# Patient Record
Sex: Male | Born: 1970
Health system: Southern US, Community
[De-identification: ages and names within clinical notes are randomized; demographics above are authoritative.]

## PROBLEM LIST (undated history)

## (undated) ENCOUNTER — Emergency Department (HOSPITAL_COMMUNITY): Payer: Self-pay | Source: Home / Self Care

---

## 1999-07-29 ENCOUNTER — Emergency Department (HOSPITAL_COMMUNITY): Admission: EM | Admit: 1999-07-29 | Discharge: 1999-07-29 | Payer: Self-pay | Admitting: Emergency Medicine

## 1999-08-03 ENCOUNTER — Emergency Department (HOSPITAL_COMMUNITY): Admission: EM | Admit: 1999-08-03 | Discharge: 1999-08-03 | Payer: Self-pay | Admitting: Emergency Medicine

## 2002-11-29 ENCOUNTER — Encounter: Payer: Self-pay | Admitting: Emergency Medicine

## 2002-11-29 ENCOUNTER — Emergency Department (HOSPITAL_COMMUNITY): Admission: EM | Admit: 2002-11-29 | Discharge: 2002-11-29 | Payer: Self-pay | Admitting: Emergency Medicine

## 2003-04-30 ENCOUNTER — Emergency Department (HOSPITAL_COMMUNITY): Admission: EM | Admit: 2003-04-30 | Discharge: 2003-04-30 | Payer: Self-pay | Admitting: Emergency Medicine

## 2004-02-07 ENCOUNTER — Emergency Department (HOSPITAL_COMMUNITY): Admission: EM | Admit: 2004-02-07 | Discharge: 2004-02-07 | Payer: Self-pay | Admitting: Emergency Medicine

## 2004-03-13 ENCOUNTER — Emergency Department (HOSPITAL_COMMUNITY): Admission: EM | Admit: 2004-03-13 | Discharge: 2004-03-13 | Payer: Self-pay | Admitting: Emergency Medicine

## 2010-01-28 ENCOUNTER — Emergency Department (HOSPITAL_COMMUNITY): Admission: EM | Admit: 2010-01-28 | Discharge: 2010-01-28 | Payer: Self-pay | Admitting: Emergency Medicine

## 2010-05-23 LAB — DIFFERENTIAL
Basophils Absolute: 0 10*3/uL (ref 0.0–0.1)
Basophils Relative: 1 % (ref 0–1)
Eosinophils Absolute: 0.1 10*3/uL (ref 0.0–0.7)
Eosinophils Relative: 1 % (ref 0–5)
Lymphocytes Relative: 32 % (ref 12–46)
Lymphs Abs: 1.5 10*3/uL (ref 0.7–4.0)
Monocytes Absolute: 0.3 10*3/uL (ref 0.1–1.0)
Monocytes Relative: 7 % (ref 3–12)
Neutro Abs: 2.7 10*3/uL (ref 1.7–7.7)
Neutrophils Relative %: 59 % (ref 43–77)

## 2010-05-23 LAB — POCT CARDIAC MARKERS
CKMB, poc: 2 ng/mL (ref 1.0–8.0)
Myoglobin, poc: 64.1 ng/mL (ref 12–200)
Troponin i, poc: <0.05 ng/mL (ref 0.00–0.09)

## 2010-05-23 LAB — CBC
HCT: 40.6 % (ref 39.0–52.0)
Hemoglobin: 14.4 g/dL (ref 13.0–17.0)
MCH: 32.9 pg (ref 26.0–34.0)
MCHC: 35.6 g/dL (ref 30.0–36.0)
MCV: 92.4 fL (ref 78.0–100.0)
Platelets: 194 10*3/uL (ref 150–400)
RBC: 4.39 MIL/uL (ref 4.22–5.81)
RDW: 11.9 % (ref 11.5–15.5)
WBC: 4.6 10*3/uL (ref 4.0–10.5)

## 2010-05-23 LAB — POCT I-STAT, CHEM 8
BUN: 19 mg/dL (ref 6–23)
Calcium, Ion: 1.19 mmol/L (ref 1.12–1.32)
Chloride: 105 mEq/L (ref 96–112)
Creatinine, Ser: 0.8 mg/dL (ref 0.4–1.5)
Glucose, Bld: 189 mg/dL — ABNORMAL HIGH (ref 70–99)
HCT: 43 % (ref 39.0–52.0)
Hemoglobin: 14.6 g/dL (ref 13.0–17.0)
Potassium: 3.6 mEq/L (ref 3.5–5.1)
Sodium: 139 mEq/L (ref 135–145)
TCO2: 27 mmol/L (ref 0–100)

## 2010-06-24 ENCOUNTER — Emergency Department (HOSPITAL_COMMUNITY)
Admission: EM | Admit: 2010-06-24 | Discharge: 2010-06-24 | Disposition: A | Discharge status: Home / Self Care | Payer: Self-pay | Attending: Emergency Medicine | Admitting: Emergency Medicine

## 2010-06-24 DIAGNOSIS — R300 Dysuria: Secondary | ICD-10-CM | POA: Insufficient documentation

## 2010-06-24 LAB — URINALYSIS, ROUTINE W REFLEX MICROSCOPIC
Bilirubin Urine: NEGATIVE
Glucose, UA: NEGATIVE mg/dL
Hgb urine dipstick: NEGATIVE
Ketones, ur: NEGATIVE mg/dL
Nitrite: NEGATIVE
Protein, ur: NEGATIVE mg/dL
Specific Gravity, Urine: 1.016 (ref 1.005–1.030)
Urobilinogen, UA: 0.2 mg/dL (ref 0.0–1.0)
pH: 6 (ref 5.0–8.0)

## 2010-06-26 LAB — GC/CHLAMYDIA PROBE AMP, GENITAL
Chlamydia, DNA Probe: NEGATIVE
GC Probe Amp, Genital: NEGATIVE

## 2012-05-21 ENCOUNTER — Emergency Department (HOSPITAL_COMMUNITY)
Admission: EM | Admit: 2012-05-21 | Discharge: 2012-05-22 | Disposition: A | Discharge status: Home / Self Care | Payer: Worker's Compensation | Attending: Emergency Medicine | Admitting: Emergency Medicine

## 2012-05-21 ENCOUNTER — Encounter (HOSPITAL_COMMUNITY): Payer: Self-pay | Admitting: *Deleted

## 2012-05-21 ENCOUNTER — Emergency Department (HOSPITAL_COMMUNITY): Payer: Worker's Compensation

## 2012-05-21 DIAGNOSIS — Y939 Activity, unspecified: Secondary | ICD-10-CM | POA: Insufficient documentation

## 2012-05-21 DIAGNOSIS — S8990XA Unspecified injury of unspecified lower leg, initial encounter: Secondary | ICD-10-CM | POA: Insufficient documentation

## 2012-05-21 DIAGNOSIS — M79672 Pain in left foot: Secondary | ICD-10-CM

## 2012-05-21 DIAGNOSIS — Z79899 Other long term (current) drug therapy: Secondary | ICD-10-CM | POA: Insufficient documentation

## 2012-05-21 DIAGNOSIS — Y9289 Other specified places as the place of occurrence of the external cause: Secondary | ICD-10-CM | POA: Insufficient documentation

## 2012-05-21 DIAGNOSIS — W230XXA Caught, crushed, jammed, or pinched between moving objects, initial encounter: Secondary | ICD-10-CM | POA: Insufficient documentation

## 2012-05-21 DIAGNOSIS — Y990 Civilian activity done for income or pay: Secondary | ICD-10-CM | POA: Insufficient documentation

## 2012-05-21 DIAGNOSIS — R269 Unspecified abnormalities of gait and mobility: Secondary | ICD-10-CM | POA: Insufficient documentation

## 2012-05-21 NOTE — ED Notes (Signed)
Pt ran over left foot with large box of food; states approx 400 lb weight over foot; bruising and swelling per patient

## 2012-05-23 NOTE — ED Provider Notes (Signed)
Medical screening examination/treatment/procedure(s) were performed by non-physician practitioner and as supervising physician I was immediately available for consultation/collaboration.  Flint Melter, MD 05/23/12 2100

## 2012-05-23 NOTE — ED Provider Notes (Signed)
History     CSN: [id number]  Arrival date & time 05/21/12  2138   First MD Initiated Contact with Patient 05/21/12 2348      Chief Complaint  Patient presents with  . Foot Injury    (Consider location/radiation/quality/duration/timing/severity/associated sxs/prior treatment) HPI Comments: 42 y.o. Male presents complaining of left foot pain after trapping it between a heavy box and the wall earlier today at work. Pt states he drove himself here and walked into the ED on his own. Pt states pain is 4/10, localized, and worse with weight bearing. Pt took no interventions.     Patient is a 42 y.o. male presenting with foot injury.  Foot Injury Associated symptoms: no fever and no neck pain     History reviewed. No pertinent past medical history.  History reviewed. No pertinent past surgical history.  No family history on file.  History  Substance Use Topics  . Smoking status: Never Smoker   . Smokeless tobacco: Not on file  . Alcohol Use: No      Review of Systems  Constitutional: Negative for fever and diaphoresis.  HENT: Negative for neck pain and neck stiffness.   Eyes: Negative for visual disturbance.  Respiratory: Negative for shortness of breath.   Cardiovascular: Negative for chest pain.  Gastrointestinal: Negative for nausea and vomiting.  Genitourinary: Negative for dysuria.  Musculoskeletal: Positive for gait problem. Negative for joint swelling.       Difficult to bear weight  Skin:       Left foot bruised  Neurological: Negative for dizziness, weakness, light-headedness, numbness and headaches.    Allergies  Penicillins  Home Medications   Current Outpatient Rx  Name  Route  Sig  Dispense  Refill  . acetaminophen (TYLENOL) 500 MG tablet   Oral   Take 500 mg by mouth every 6 (six) hours as needed for pain.         Marland Kitchen ibuprofen (ADVIL,MOTRIN) 200 MG tablet   Oral   Take 200 mg by mouth every 6 (six) hours as needed for pain.         Marland Kitchen  lisinopril (PRINIVIL,ZESTRIL) 20 MG tablet   Oral   Take 20 mg by mouth every morning.           BP 109/72  Pulse 91  Temp(Src) 98 F (36.7 C) (Oral)  Resp 20  Ht 5\' 10"  (1.778 m)  Wt 180 lb (81.647 kg)  BMI 25.83 kg/m2  SpO2 96%  Physical Exam  Nursing note and vitals reviewed. Constitutional: He is oriented to person, place, and time. He appears well-developed and well-nourished. No distress.  HENT:  Head: Normocephalic and atraumatic.  Eyes: Conjunctivae and EOM are normal.  Neck: Normal range of motion. Neck supple.  No meningeal signs  Cardiovascular: Normal rate, regular rhythm and normal heart sounds.  Exam reveals no gallop and no friction rub.   No murmur heard. Pulmonary/Chest: Effort normal and breath sounds normal. No respiratory distress. He has no wheezes. He has no rales. He exhibits no tenderness.  Abdominal: Soft. Bowel sounds are normal. He exhibits no distension. There is no tenderness. There is no rebound and no guarding.  Musculoskeletal: Normal range of motion. He exhibits no edema and no tenderness.  FROM of injured extremity. 5/5 strength. No bony tenderness. No deformity. Ambulates well. No limp.   Neurological: He is alert and oriented to person, place, and time. No cranial nerve deficit.  No focal deficits. Sensation to light touch intact.  Skin: Skin is warm and dry. He is not diaphoretic. No erythema.  Minimal swelling, small bruise on left foot  Psychiatric: He has a normal mood and affect.    ED Course  Procedures (including critical care time)  Labs Reviewed - No data to display Dg Foot Complete Left  05/21/2012  *RADIOLOGY REPORT*  Clinical Data: Foot bruising and swelling, recent trauma.  LEFT FOOT - COMPLETE 3+ VIEW  Comparison: None.  Findings: No displaced acute fracture.  No dislocation. Contour irregularity along the anterior process of the calcaneus is favored to be a congenital variant or sequelae of prior trauma.  No aggressive  osseous lesion.  IMPRESSION: Contour irregularity along the anterior process of the calcaneus is nonspecific, however, favored to be a congenital variant or sequelae of prior trauma. Correlate for point tenderness.  Otherweise, no acute osseous finding of the left foot.   Original Report Authenticated By: Jearld Lesch, M.D.      1. Foot pain, left       MDM  Pt ambulates well with no sign of limp. Swelling is minimal. Imaging shows no fracture. Directed pt to ice injury, take acetaminophen or ibuprofen for pain, and to elevate and rest the injury when possible.  Pt stated that he really just needed to be cleared to return to work. Provided back to work note.   Glade Nurse, PA-C 05/23/12 2052

## 2016-01-31 ENCOUNTER — Ambulatory Visit (INDEPENDENT_AMBULATORY_CARE_PROVIDER_SITE_OTHER): Payer: BLUE CROSS/BLUE SHIELD | Admitting: Sports Medicine

## 2016-01-31 ENCOUNTER — Encounter: Payer: Self-pay | Admitting: Sports Medicine

## 2016-01-31 ENCOUNTER — Ambulatory Visit (INDEPENDENT_AMBULATORY_CARE_PROVIDER_SITE_OTHER): Payer: BLUE CROSS/BLUE SHIELD

## 2016-01-31 DIAGNOSIS — M722 Plantar fascial fibromatosis: Secondary | ICD-10-CM | POA: Diagnosis not present

## 2016-01-31 DIAGNOSIS — M79671 Pain in right foot: Secondary | ICD-10-CM

## 2016-01-31 MED ORDER — METHYLPREDNISOLONE 4 MG PO TBPK: ORAL_TABLET | ORAL | 0 refills | Status: AC

## 2016-01-31 MED ORDER — MELOXICAM 15 MG PO TABS: 15.0000 mg | ORAL_TABLET | Freq: Every day | ORAL | 0 refills | Status: DC

## 2016-01-31 NOTE — Progress Notes (Signed)
  Subjective: Delphina CahillCharles Hild is a 45 y.o. male patient presents to office with complaint of heel pain on the right. Patient admits to post static dyskinesia for 2 months in duration. Patient has treated this problem with heat and change in shoes with no relief. Denies any other pedal complaints.   There are no active problems to display for this patient.   Current Outpatient Prescriptions on File Prior to Visit  Medication Sig Dispense Refill  . acetaminophen (TYLENOL) 500 MG tablet Take 500 mg by mouth every 6 (six) hours as needed for pain.    Marland Kitchen. ibuprofen (ADVIL,MOTRIN) 200 MG tablet Take 200 mg by mouth every 6 (six) hours as needed for pain.    Marland Kitchen. lisinopril (PRINIVIL,ZESTRIL) 20 MG tablet Take 20 mg by mouth every morning.     No current facility-administered medications on file prior to visit.     Allergies  Allergen Reactions  . Penicillins Other (See Comments)    Childhood allergy; reaction unknown    Objective: Physical Exam General: The patient is alert and oriented x3 in no acute distress.  Dermatology: Skin is warm, dry and supple bilateral lower extremities. Nails 1-10 are normal. There is no erythema, edema, no eccymosis, no open lesions present. Integument is otherwise unremarkable.  Vascular: Dorsalis Pedis pulse and Posterior Tibial pulse are 2/4 bilateral. Capillary fill time is immediate to all digits.  Neurological: Grossly intact to light touch with an achilles reflex of +2/5 and a negative Tinel's sign bilateral.  Musculoskeletal: Minimal Tenderness to palpation at the medial calcaneal tubercale and through the insertion of the plantar fascia on the right foot. No pain with compression of calcaneus bilateral. No pain with tuning fork to calcaneus bilateral. No pain with calf compression bilateral. There is decreased Ankle joint range of motion bilateral. All other joints range of motion within normal limits bilateral. Strength 5/5 in all groups bilateral.    Xray, Right foot:  Normal osseous mineralization. Joint spaces preserved. No dislocation/boney destruction. Avulsion fleck posterior calcaneus. Calcaneal spur present with mild thickening of plantar fascia. No other soft tissue abnormalities or radiopaque foreign bodies.   Assessment and Plan: Problem List Items Addressed This Visit    None    Visit Diagnoses    Right foot pain    -  Primary   Relevant Orders   DG Foot 2 Views Right   Plantar fasciitis          -Complete examination performed.  -Xrays reviewed -Discussed with patient in detail the condition of plantar fasciitis, how this occurs and general treatment options. Explained both conservative and surgical treatments.  -No injection due to minimal symptoms on right.  -Rx Meloxicam to start after Medrol dose pack is completed -Recommended good supportive shoes and advised use of OTC insert. Explained to patient that if these orthoses work well, we will continue with these. If these do not improve his condition and  pain, we will consider custom molded orthoses. - Explained in detail the use of the fascial brace which was dispensed at today's visit. -Explained and dispensed to patient daily stretching exercises. -Recommend patient to ice affected area 1-2x daily. -Patient to return to office in 4 weeks for follow up or sooner if problems or questions arise.  Asencion Islamitorya Romanita Fager, DPM

## 2016-01-31 NOTE — Patient Instructions (Signed)

## 2016-02-28 ENCOUNTER — Ambulatory Visit (INDEPENDENT_AMBULATORY_CARE_PROVIDER_SITE_OTHER): Payer: BLUE CROSS/BLUE SHIELD | Admitting: Sports Medicine

## 2016-02-28 ENCOUNTER — Encounter: Payer: Self-pay | Admitting: Sports Medicine

## 2016-02-28 ENCOUNTER — Ambulatory Visit: Payer: BLUE CROSS/BLUE SHIELD | Admitting: Sports Medicine

## 2016-02-28 DIAGNOSIS — M79671 Pain in right foot: Secondary | ICD-10-CM

## 2016-02-28 DIAGNOSIS — M722 Plantar fascial fibromatosis: Secondary | ICD-10-CM | POA: Diagnosis not present

## 2016-02-28 DIAGNOSIS — M79672 Pain in left foot: Secondary | ICD-10-CM

## 2016-02-28 NOTE — Progress Notes (Signed)
Subjective: Jeremy May is a 45 y.o. male patient returns for follow-up exam of heel pain on the right. Patient admits to post static dyskinesia now on left foot. States that he was doing well until he completed his dose pack; has not been stretching or icing or wearing his brace. Denies any other pedal complaints.   There are no active problems to display for this patient.   Current Outpatient Prescriptions on File Prior to Visit  Medication Sig Dispense Refill  . acetaminophen (TYLENOL) 500 MG tablet Take 500 mg by mouth every 6 (six) hours as needed for pain.    Marland Kitchen. Clocortolone Pivalate (CLODERM) 0.1 % cream apply to affected area three times a day  0  . FLUARIX QUADRIVALENT 0.5 ML injection inject 0.5 milliliter intramuscularly  0  . ibuprofen (ADVIL,MOTRIN) 200 MG tablet Take 200 mg by mouth every 6 (six) hours as needed for pain.    Marland Kitchen. ketoconazole (NIZORAL) 2 % cream   0  . lisinopril (PRINIVIL,ZESTRIL) 20 MG tablet Take 20 mg by mouth every morning.    . meloxicam (MOBIC) 15 MG tablet Take 1 tablet (15 mg total) by mouth daily. 30 tablet 0  . methylPREDNISolone (MEDROL DOSEPAK) 4 MG TBPK tablet Take 1st as instructed 21 tablet 0  . tamsulosin (FLOMAX) 0.4 MG CAPS capsule   0   No current facility-administered medications on file prior to visit.     Allergies  Allergen Reactions  . Penicillins Other (See Comments)    Childhood allergy; reaction unknown    Objective: Physical Exam General: The patient is alert and oriented x3 in no acute distress.  Dermatology: Skin is warm, dry and supple bilateral lower extremities. Nails 1-10 are normal. There is no erythema, edema, no eccymosis, no open lesions present. Integument is otherwise unremarkable.  Vascular: Dorsalis Pedis pulse and Posterior Tibial pulse are 2/4 bilateral. Capillary fill time is immediate to all digits.  Neurological: Grossly intact to light touch with an achilles reflex of +2/5 and a negative Tinel's sign  bilateral.  Musculoskeletal: Minimal Tenderness to palpation at the medial calcaneal tubercale and through the insertion of the plantar fascia on the right foot extending to arch and first metatarsophalangeal joint. Subjective tenderness on the left. No pain with compression of calcaneus bilateral. No pain with tuning fork to calcaneus bilateral. No pain with calf compression bilateral. There is decreased Ankle joint range of motion bilateral. All other joints range of motion within normal limits bilateral. Strength 5/5 in all groups bilateral.   Assessment and Plan: Problem List Items Addressed This Visit    None    Visit Diagnoses    Plantar fasciitis    -  Primary   Right foot pain       Left foot pain          -Complete examination performed.  -Previous Xrays reviewed -Discussed with patient in detail the condition of plantar fasciitis, how this occurs and general treatment options. Explained both conservative and surgical treatments.  -No injection due to minimal symptoms bilateral  -Continue with meloxicam until completed -As previous Recommended good supportive shoes and advised use of OTC insert. Explained to patient that if these orthoses work well, we will continue with these. If these do not improve his condition and  pain, we will consider custom molded orthoses. - Explained in detail the use of the fascial brace which was dispensed at today's visit for the left and advised patient to return to using brace for right  as instructed. -Re-Explained and dispensed to patient daily stretching exercises and advised patient to be compliant with stretching to help his condition gets better -Recommend patient to ice affected area 1-2x daily as previously instructed. -Patient to return to office in 6 weeks for follow up or sooner if problems or questions arise. If no improvement and if pain is localized to a trigger point area, We'll consider injection.  Asencion Islamitorya Marykathleen Russi, DPM

## 2016-02-28 NOTE — Patient Instructions (Signed)

## 2016-04-18 ENCOUNTER — Telehealth: Payer: Self-pay | Admitting: *Deleted

## 2016-04-18 DIAGNOSIS — M79671 Pain in right foot: Secondary | ICD-10-CM

## 2016-04-18 DIAGNOSIS — M722 Plantar fascial fibromatosis: Secondary | ICD-10-CM

## 2016-04-18 MED ORDER — MELOXICAM 15 MG PO TABS
15.0000 mg | ORAL_TABLET | Freq: Every day | ORAL | 0 refills | Status: DC
Start: 2016-04-18 — End: 2022-10-01

## 2016-04-18 NOTE — Telephone Encounter (Signed)
Pt states Dr. Marylene LandStover said if he was still having pain after 6 months she would call in a oral medication. I spoke with pt, he states he began to see improvement after the steroid pack and the next day he began the Meloxicam. I told pt I would be able to refill the meloxicam, but he would need to make an appt prior to future refills. Pt states he is having to make otheer doctors appts and will call back.

## 2017-07-11 ENCOUNTER — Other Ambulatory Visit: Payer: Self-pay | Admitting: Gastroenterology

## 2017-07-11 DIAGNOSIS — R1032 Left lower quadrant pain: Secondary | ICD-10-CM

## 2017-07-22 ENCOUNTER — Other Ambulatory Visit: Payer: Self-pay | Admitting: Otolaryngology

## 2017-07-22 ENCOUNTER — Ambulatory Visit
Admission: RE | Admit: 2017-07-22 | Discharge: 2017-07-22 | Disposition: A | Discharge status: Home / Self Care | Payer: BLUE CROSS/BLUE SHIELD | Source: Ambulatory Visit | Attending: Otolaryngology | Admitting: Otolaryngology

## 2017-07-22 DIAGNOSIS — J320 Chronic maxillary sinusitis: Secondary | ICD-10-CM

## 2017-11-14 ENCOUNTER — Ambulatory Visit
Admission: RE | Admit: 2017-11-14 | Discharge: 2017-11-14 | Disposition: A | Discharge status: Home / Self Care | Payer: BLUE CROSS/BLUE SHIELD | Source: Ambulatory Visit | Attending: Family Medicine | Admitting: Family Medicine

## 2017-11-14 ENCOUNTER — Other Ambulatory Visit: Payer: Self-pay | Admitting: Family Medicine

## 2017-11-14 DIAGNOSIS — R0602 Shortness of breath: Secondary | ICD-10-CM

## 2018-03-20 ENCOUNTER — Other Ambulatory Visit: Payer: Self-pay | Admitting: Otolaryngology

## 2018-03-20 DIAGNOSIS — K219 Gastro-esophageal reflux disease without esophagitis: Secondary | ICD-10-CM

## 2018-03-24 ENCOUNTER — Ambulatory Visit
Admission: RE | Admit: 2018-03-24 | Discharge: 2018-03-24 | Disposition: A | Discharge status: Home / Self Care | Payer: BLUE CROSS/BLUE SHIELD | Source: Ambulatory Visit | Attending: Otolaryngology | Admitting: Otolaryngology

## 2018-03-24 DIAGNOSIS — K219 Gastro-esophageal reflux disease without esophagitis: Secondary | ICD-10-CM

## 2018-05-23 ENCOUNTER — Encounter: Payer: Self-pay | Admitting: Gastroenterology

## 2019-05-21 ENCOUNTER — Ambulatory Visit: Payer: Self-pay | Attending: Internal Medicine

## 2019-05-21 DIAGNOSIS — Z23 Encounter for immunization: Secondary | ICD-10-CM

## 2019-05-21 NOTE — Progress Notes (Signed)
   Covid-19 Vaccination Clinic  Name:  Jeremy May    MRN: [id number] DOB: [birth year]  05/21/2019  [name] was observed post Covid-19 immunization for 15 minutes without incident. He was provided with Vaccine Information Sheet and instruction to access the V-Safe system.   [name] was instructed to call 911 with any severe reactions post vaccine: Marland Kitchen Difficulty breathing  . Swelling of face and throat  . A fast heartbeat  . A bad rash all over body  . Dizziness and weakness   Immunizations Administered    Name Date Dose VIS Date Route   Pfizer COVID-19 Vaccine 05/21/2019  8:25 AM 0.3 mL 02/20/2019 Intramuscular   Manufacturer: [employer], [employer]   Lot: [id number]   NDC: [address]-4

## 2019-06-15 ENCOUNTER — Ambulatory Visit: Payer: Self-pay | Attending: Internal Medicine

## 2019-06-15 DIAGNOSIS — Z23 Encounter for immunization: Secondary | ICD-10-CM

## 2019-06-15 NOTE — Progress Notes (Signed)
   Covid-19 Vaccination Clinic  Name:  Jeremy May    MRN: [id number] DOB: 10-16-1970  06/15/2019  [name] was observed post Covid-19 immunization for 15 minutes without incident. He was provided with Vaccine Information Sheet and instruction to access the V-Safe system.   [name] was instructed to call 911 with any severe reactions post vaccine: Marland Kitchen Difficulty breathing  . Swelling of face and throat  . A fast heartbeat  . A bad rash all over body  . Dizziness and weakness   Immunizations Administered    Name Date Dose VIS Date Route   Pfizer COVID-19 Vaccine 06/15/2019  8:27 AM 0.3 mL 02/20/2019 Intramuscular   Manufacturer: [employer], [employer]   Lot: [id number]   NDC: [address]-8

## 2020-04-17 IMAGING — DX DG CHEST 2V
2 series · 2 of 2 positions shown · non-contrast
Comparison: None.

CLINICAL DATA: Shortness of breath with neck tightness since November 05, 2017.

EXAM:
CHEST - 2 VIEW

[dg chest 2 view (1 of 2)]
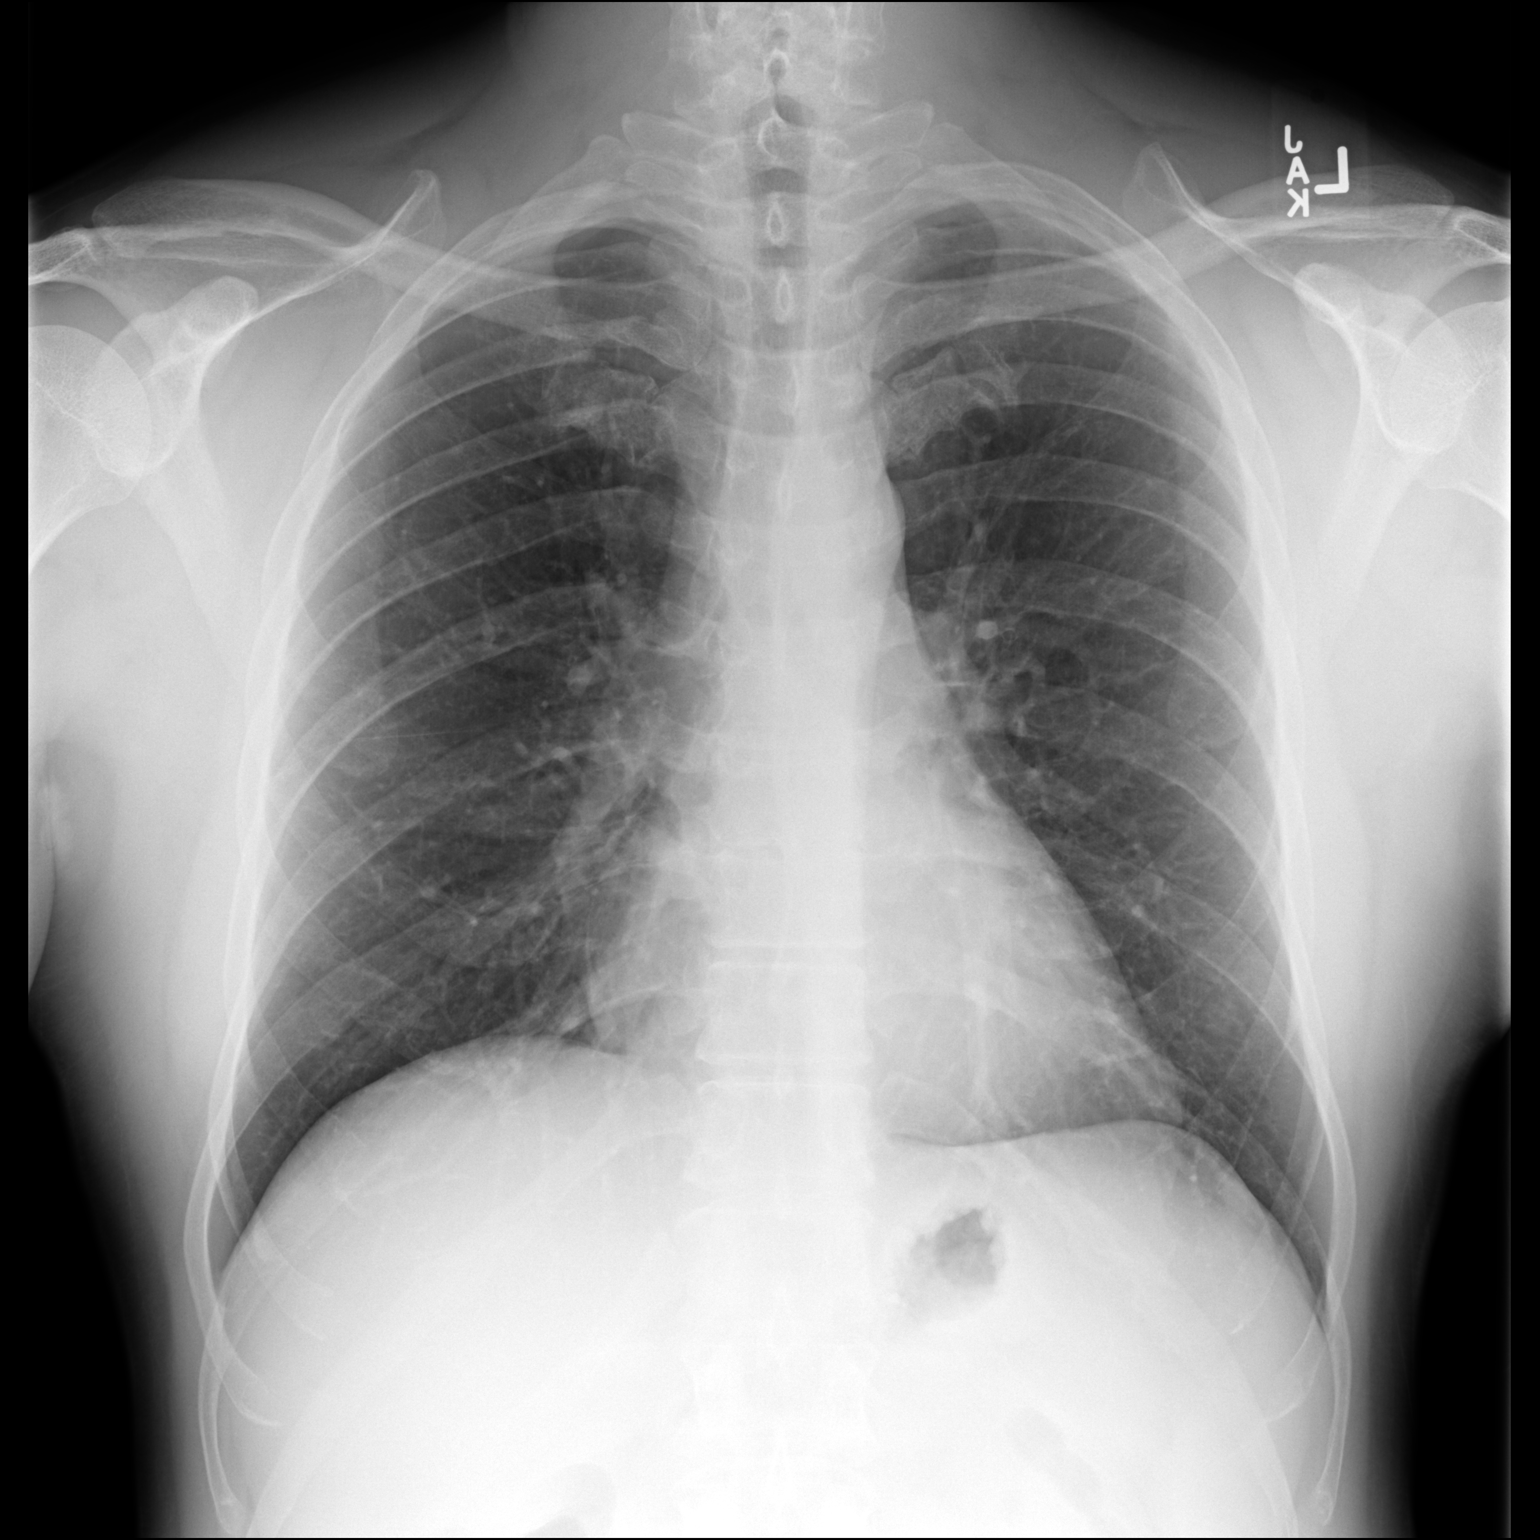

[dg chest 2 view (2 of 2)]
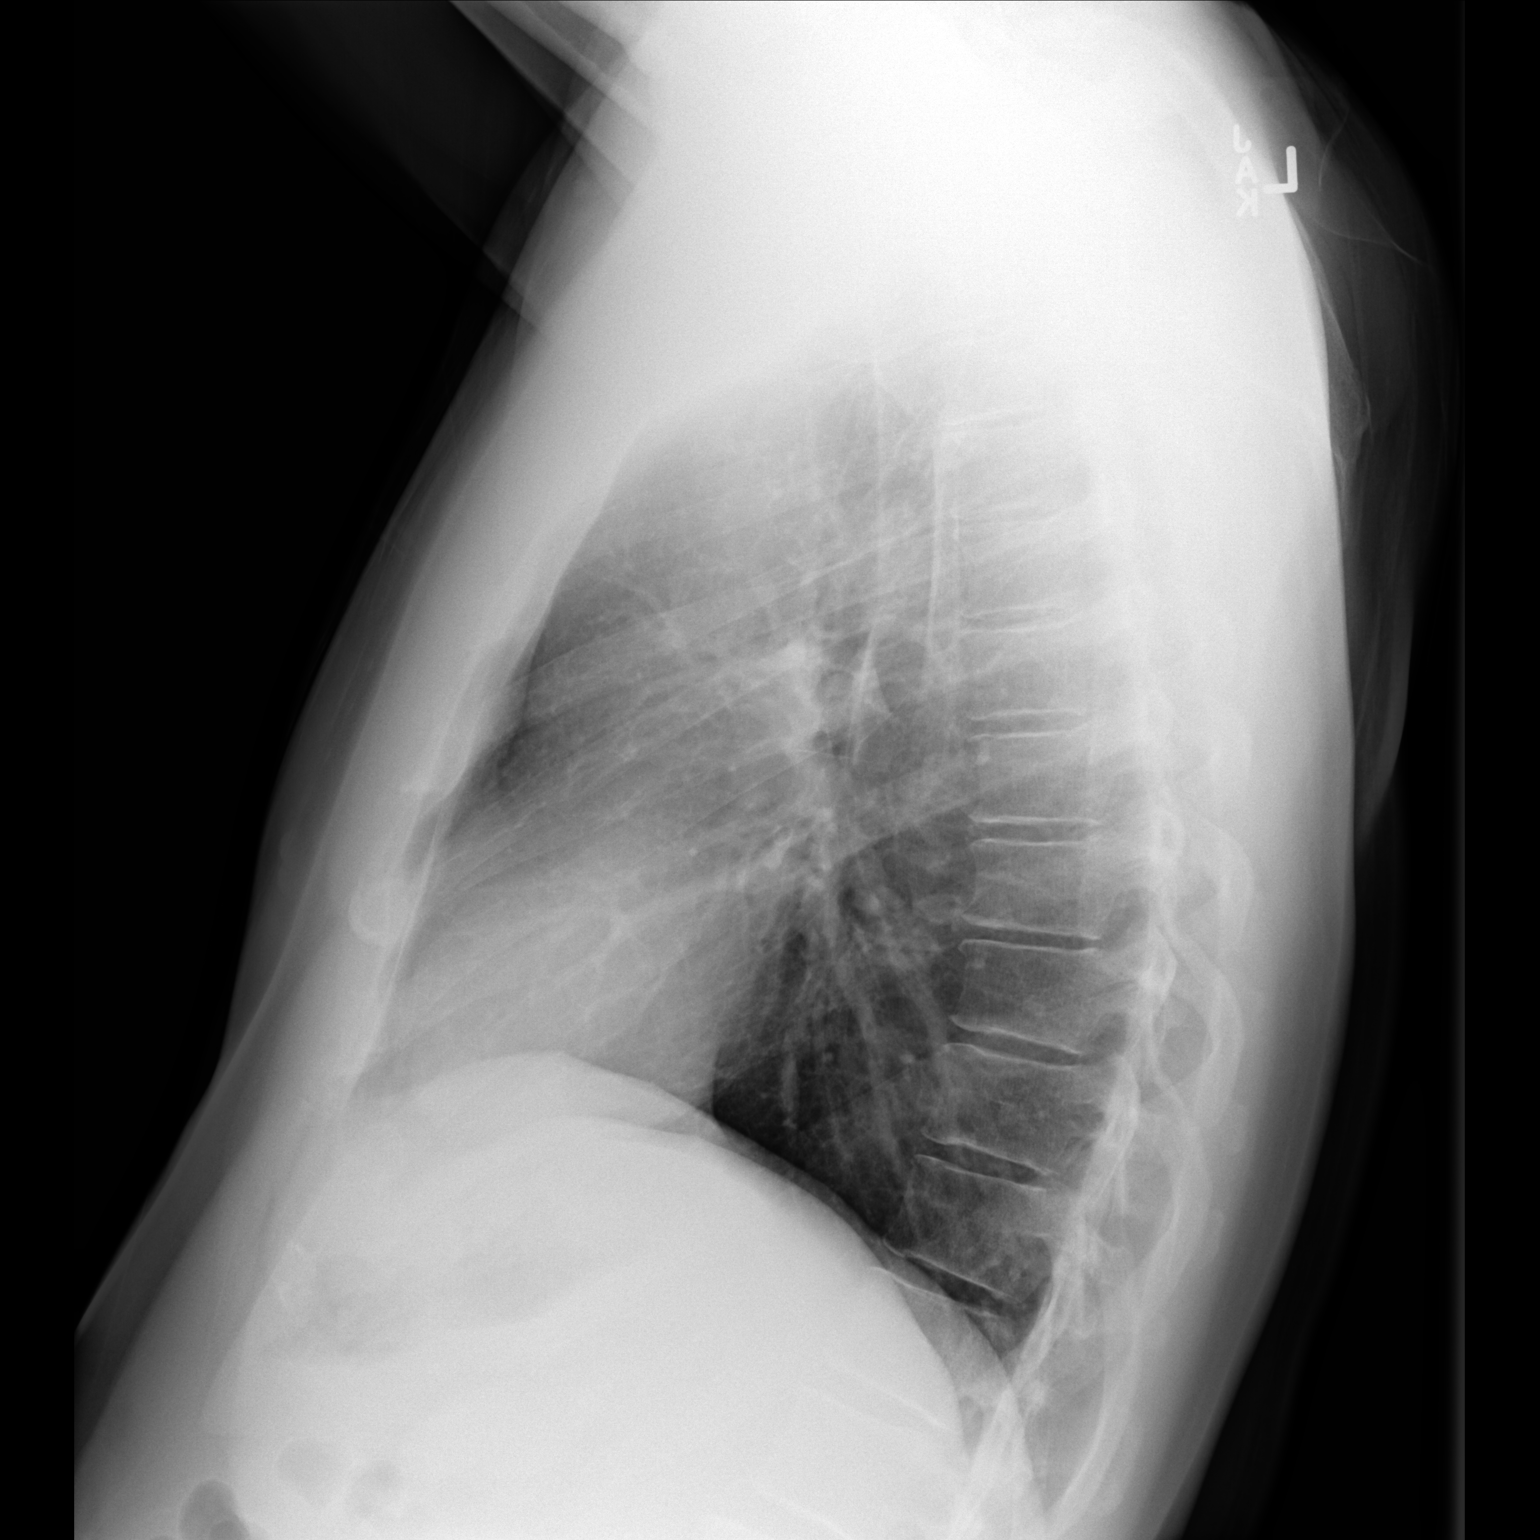

[2 of 2 positions shown; findings below may reference images not displayed]

FINDINGS: The heart size and mediastinal contours are within normal limits.
Both lungs are clear. The visualized skeletal structures are
unremarkable.
IMPRESSION: No active cardiopulmonary disease.

## 2022-10-01 ENCOUNTER — Ambulatory Visit (INDEPENDENT_AMBULATORY_CARE_PROVIDER_SITE_OTHER): Payer: No Typology Code available for payment source | Admitting: Podiatry

## 2022-10-01 ENCOUNTER — Encounter: Payer: Self-pay | Admitting: Podiatry

## 2022-10-01 ENCOUNTER — Ambulatory Visit (INDEPENDENT_AMBULATORY_CARE_PROVIDER_SITE_OTHER): Payer: No Typology Code available for payment source

## 2022-10-01 VITALS — BP 136/84 | HR 78

## 2022-10-01 DIAGNOSIS — M779 Enthesopathy, unspecified: Secondary | ICD-10-CM | POA: Diagnosis not present

## 2022-10-01 DIAGNOSIS — M7752 Other enthesopathy of left foot: Secondary | ICD-10-CM | POA: Diagnosis not present

## 2022-10-01 MED ORDER — MELOXICAM 15 MG PO TABS: 15.0000 mg | ORAL_TABLET | Freq: Every day | ORAL | 0 refills | Status: AC | PRN

## 2022-10-01 NOTE — Progress Notes (Signed)
Subjective:   Patient ID: Jeremy May, male   DOB: 52 y.o.   MRN: [id number]   HPI Chief Complaint  Patient presents with   Foot Pain    "I'm having problems with the left ankle." N - ankle pain L - lateral ankle left D- couple mos O - suddenly C - ache, sharp shooting pain every now and then A - walking, standing T - Aleve, Tylenol    Sometimes getting sharp shooting pain Lateral ankle going up Sometimes have to sit down No injuries he remembers- may have bumped it- maybe a remote hisotory but not since this tarted.   ROS  No past medical history on file.  No past surgical history on file.   Current Outpatient Medications:    acetaminophen (TYLENOL) 500 MG tablet, Take 500 mg by mouth every 6 (six) hours as needed for pain., Disp: , Rfl:    ibuprofen (ADVIL,MOTRIN) 200 MG tablet, Take 200 mg by mouth every 6 (six) hours as needed for pain., Disp: , Rfl:    Clocortolone Pivalate (CLODERM) 0.1 % cream, apply to affected area three times a day (Patient not taking: Reported on 10/01/2022), Disp: , Rfl: 0   FLUARIX QUADRIVALENT 0.5 ML injection, inject 0.5 milliliter intramuscularly (Patient not taking: Reported on 10/01/2022), Disp: , Rfl: 0   ketoconazole (NIZORAL) 2 % cream, , Disp: , Rfl: 0   lisinopril (PRINIVIL,ZESTRIL) 20 MG tablet, Take 20 mg by mouth every morning. (Patient not taking: Reported on 10/01/2022), Disp: , Rfl:    meloxicam (MOBIC) 15 MG tablet, Take 1 tablet (15 mg total) by mouth daily. (Patient not taking: Reported on 10/01/2022), Disp: 30 tablet, Rfl: 0   methylPREDNISolone (MEDROL DOSEPAK) 4 MG TBPK tablet, Take 1st as instructed (Patient not taking: Reported on 10/01/2022), Disp: 21 tablet, Rfl: 0   tamsulosin (FLOMAX) 0.4 MG CAPS capsule, , Disp: , Rfl: 0  Allergies  Allergen Reactions   Penicillins Other (See Comments)    Childhood allergy; reaction unknown         Objective:  Physical Exam  General: AAO x3, NAD  Dermatological: Skin is  warm, dry and supple bilateral. There are no open sores, no preulcerative lesions, no rash or signs of infection present.  Vascular: Dorsalis Pedis artery and Posterior Tibial artery pedal pulses are 2/4 bilateral with immedate capillary fill time. There is no pain with calf compression, swelling, warmth, erythema.   Neruologic: Grossly intact via light touch bilateral.  Musculoskeletal: Tenderness palpation lateral aspect the left ankle in the course of the peroneal tendon as well as the sinus tarsi.  No area of pinpoint tenderness.  Flexor, extensor tendons remain intact.  MMT 5/5.  Gait: Unassisted, Nonantalgic.       Assessment:   Left ankle capsulitis, tendinitis     Plan:  -Treatment options discussed including all alternatives, risks, and complications -Etiology of symptoms were discussed -X-rays were obtained and reviewed with the patient.  3 views left ankle were obtained.  No evidence of acute fracture.  Joint space maintained.  Likely old injury noted to the medial malleolus. -Tri-Lock ankle brace was dispensed to help facilitate soft tissue healing. -Discussed stretching, rehab exercises -Shoes, good arch support. -No improvement likely advanced imaging -Prescribed mobic. Discussed side effects of the medication and directed to stop if any are to occur and call the office.   Return if symptoms worsen or fail to improve.  Vivi Barrack DPM

## 2022-10-01 NOTE — Patient Instructions (Signed)
For instructions on how to put on your Tri-Lock Ankle Brace, please visit [email or web address]  For inserts I like POWERSTEPS, SUPERFEET, [employer]   Ankle Sprain, Phase I Rehab An ankle sprain is an injury to the tissues that connect bone to bone (ligaments) in your ankle. Ankle sprains can cause stiffness, loss of motion, and loss of strength. Ask your health care provider which exercises are safe for you. Do exercises exactly as told by your provider and adjust them as directed. It is normal to feel mild stretching, pulling, tightness, or discomfort as you do these exercises. Stop right away if you feel sudden pain or your pain gets worse. Do not begin these exercises until told by your provider. Stretching and range-of-motion exercises These exercises warm up your muscles and joints. They can improve the movement and flexibility of your lower leg and ankle. They also help to relieve pain and stiffness. Gastroc and soleus stretch This exercise is also called a calf stretch. It stretches the muscles in the back of the lower leg. These muscles are the gastrocnemius, or gastroc, and the soleus. Sit on the floor with your left / right leg extended. Loop a belt or towel around the ball of your left / right foot. The ball of your foot is on the walking surface, right under your toes. Keep your left / right ankle and foot relaxed and keep your knee straight. Use the belt or towel to pull your foot toward you. You should feel a gentle stretch behind your calf or knee in your gastroc muscle. Hold this position for __________ seconds, then release to the starting position. Repeat the exercise with your knee bent. You can put a pillow or a rolled bath towel under your knee to support it. You should feel a stretch deep in your calf in the soleus muscle or at your Achilles tendon. Repeat __________ times. Complete this exercise __________ times a day. Ankle alphabet  Sit with your left / right leg  supported at the lower leg. Do not rest your foot on anything. Make sure your foot has room to move freely. Think of your left / right foot as a paintbrush. Move your foot to trace each letter of the alphabet in the air. Keep your hip and knee still while you trace. Make the letters as large as you can without feeling discomfort. Trace every letter from A to Z. Repeat __________ times. Complete this exercise __________ times a day. Strengthening exercises These exercises build strength and endurance in your ankle and lower leg. Endurance is the ability to use your muscles for a long time, even after they get tired. Ankle dorsiflexion  Secure a rubber exercise band or tube to an object, such as a table leg, that will stay still when the band is pulled. Secure the other end around your left / right foot. Sit on the floor facing the object, with your left / right leg extended. The band or tube should be slightly tense when your foot is relaxed. Slowly bring your foot toward you, bringing the top of your foot toward your shin (dorsiflexion), and pulling the band tighter. Hold this position for __________ seconds. Slowly return your foot to the starting position. Repeat __________ times. Complete this exercise __________ times a day. Ankle plantar flexion  Sit on the floor with your left / right leg extended. Loop a rubber exercise tube or band around the ball of your left / right foot. The ball of your foot is on  the walking surface, right under your toes. Hold the ends of the band or tube in your hands. The band or tube should be slightly tense when your foot is relaxed. Slowly point your foot and toes downward to tilt the top of your foot away from your shin (plantar flexion). Hold this position for __________ seconds. Slowly return your foot to the starting position. Repeat __________ times. Complete this exercise __________ times a day. Ankle eversion  Sit on the floor with your legs  straight out in front of you. Loop a rubber exercise band or tube around the ball of your left / right foot. The ball of your foot is on the walking surface, right under your toes. Hold the ends of the band in your hands or secure the band to a stable object. The band or tube should be slightly tense when your foot is relaxed. Slowly push your foot outward, away from your other leg (eversion). Hold this position for __________ seconds. Slowly return your foot to the starting position. Repeat __________ times. Complete this exercise __________ times a day. This information is not intended to replace advice given to you by your health care provider. Make sure you discuss any questions you have with your health care provider. Document Revised: 12/20/2021 Document Reviewed: 12/20/2021 Elsevier Patient Education  2024 [employer].
